# Patient Record
Sex: Male | Born: 1980 | Race: White | Hispanic: Yes | Marital: Single | Smoking: Never smoker
Health system: Southern US, Community
[De-identification: ages and names within clinical notes are randomized; demographics above are authoritative.]

## PROBLEM LIST (undated history)

## (undated) DIAGNOSIS — K5792 Diverticulitis of intestine, part unspecified, without perforation or abscess without bleeding: Secondary | ICD-10-CM

## (undated) DIAGNOSIS — I10 Essential (primary) hypertension: Secondary | ICD-10-CM

---

## 2015-02-01 ENCOUNTER — Encounter: Payer: Self-pay | Admitting: *Deleted

## 2015-02-01 ENCOUNTER — Ambulatory Visit
Admission: EM | Admit: 2015-02-01 | Discharge: 2015-02-01 | Disposition: A | Discharge status: Home / Self Care | Payer: Managed Care, Other (non HMO) | Attending: Family Medicine | Admitting: Family Medicine

## 2015-02-01 DIAGNOSIS — Z024 Encounter for examination for driving license: Secondary | ICD-10-CM

## 2015-02-01 DIAGNOSIS — Z029 Encounter for administrative examinations, unspecified: Secondary | ICD-10-CM | POA: Diagnosis not present

## 2015-02-01 LAB — URINALYSIS COMPLETE WITH MICROSCOPIC (ARMC ONLY)
BILIRUBIN URINE: NEGATIVE
Bacteria, UA: NONE SEEN
GLUCOSE, UA: NEGATIVE mg/dL
Hgb urine dipstick: NEGATIVE
Ketones, ur: NEGATIVE mg/dL
Leukocytes, UA: NEGATIVE
Nitrite: NEGATIVE
Protein, ur: NEGATIVE mg/dL
SQUAMOUS EPITHELIAL / LPF: NONE SEEN
Specific Gravity, Urine: >1.03 — ABNORMAL HIGH (ref 1.005–1.030)
pH: 6 (ref 5.0–8.0)

## 2015-02-01 NOTE — ED Notes (Signed)
CDL physical

## 2015-02-01 NOTE — ED Provider Notes (Signed)
CSN: [id number]     Arrival date & time 02/01/15  0908 History   First MD Initiated Contact with Patient 02/01/15 1047     Chief Complaint  Patient presents with  . Commercial Driver's License Exam   (Consider location/radiation/quality/duration/timing/severity/associated sxs/prior Treatment) HPI   This a 35 year old male presents for a DOT physical. He has no previous history of any medical or surgical conditions.  History reviewed. No pertinent past medical history. History reviewed. No pertinent past surgical history. History reviewed. No pertinent family history. Social History  Substance Use Topics  . Smoking status: Never Smoker   . Smokeless tobacco: Never Used  . Alcohol Use: Yes    Review of Systems  All other systems reviewed and are negative.   Allergies  Shellfish allergy  Home Medications   Prior to Admission medications   Not on File   Meds Ordered and Administered this Visit  Medications - No data to display  BP 141/90 mmHg  Temp(Src) 97.4 F (36.3 C) (Oral)  Resp 20  Ht 5\' 10"  (1.778 m)  Wt 285 lb (129.275 kg)  BMI 40.89 kg/m2  SpO2 100% No data found.   Physical Exam  Constitutional:  Refer to DOT sheet  Nursing note and vitals reviewed.   ED Course  Procedures (including critical care time)  Labs Review Labs Reviewed  URINALYSIS COMPLETEWITH MICROSCOPIC ([facility] ONLY) - Abnormal; Notable for the following:    Specific Gravity, Urine >1.030 (*)    All other components within normal limits    Imaging Review No results found.   Visual Acuity Review  Right Eye Distance:   Left Eye Distance:   Bilateral Distance:    Right Eye Near:   Left Eye Near:    Bilateral Near:         MDM   1. Driver's permit physical examination    I will qualify him for 2 year certificate. His initial blood pressure was elevated but after a repeat later into the exam it was more normal. He states that his blood pressures are usually in the 120  systolic range. He may have an element of whitecoat hypertension accounting for his elevated blood pressures. I've asked him to watch these but they seem to remain elevated he should see his primary care physician.    Lutricia FeilWilliam P Nazia Rhines, PA-C 02/01/15 1157  Lutricia FeilWilliam P Muntaha Vermette, PA-C 02/01/15 1159

## 2015-06-25 ENCOUNTER — Emergency Department
Admission: EM | Admit: 2015-06-25 | Discharge: 2015-06-25 | Disposition: A | Discharge status: Home / Self Care | Payer: Managed Care, Other (non HMO) | Attending: Emergency Medicine | Admitting: Emergency Medicine

## 2015-06-25 ENCOUNTER — Encounter: Payer: Self-pay | Admitting: Emergency Medicine

## 2015-06-25 ENCOUNTER — Emergency Department: Payer: Managed Care, Other (non HMO)

## 2015-06-25 DIAGNOSIS — R109 Unspecified abdominal pain: Secondary | ICD-10-CM

## 2015-06-25 DIAGNOSIS — K5732 Diverticulitis of large intestine without perforation or abscess without bleeding: Secondary | ICD-10-CM | POA: Insufficient documentation

## 2015-06-25 LAB — COMPREHENSIVE METABOLIC PANEL
ALBUMIN: 4.2 g/dL (ref 3.5–5.0)
ALK PHOS: 53 U/L (ref 38–126)
ALT: 63 U/L (ref 17–63)
AST: 29 U/L (ref 15–41)
Anion gap: 9 (ref 5–15)
BILIRUBIN TOTAL: 1.3 mg/dL — AB (ref 0.3–1.2)
BUN: 11 mg/dL (ref 6–20)
CALCIUM: 9 mg/dL (ref 8.9–10.3)
CO2: 23 mmol/L (ref 22–32)
CREATININE: 0.66 mg/dL (ref 0.61–1.24)
Chloride: 104 mmol/L (ref 101–111)
GFR calc non Af Amer: >60 mL/min (ref >60)
GLUCOSE: 99 mg/dL (ref 65–99)
Potassium: 3.6 mmol/L (ref 3.5–5.1)
SODIUM: 136 mmol/L (ref 135–145)
TOTAL PROTEIN: 8.1 g/dL (ref 6.5–8.1)

## 2015-06-25 LAB — URINALYSIS COMPLETE WITH MICROSCOPIC (ARMC ONLY)
BILIRUBIN URINE: NEGATIVE
Bacteria, UA: NONE SEEN
GLUCOSE, UA: NEGATIVE mg/dL
Hgb urine dipstick: NEGATIVE
KETONES UR: NEGATIVE mg/dL
Leukocytes, UA: NEGATIVE
Nitrite: NEGATIVE
Protein, ur: NEGATIVE mg/dL
Specific Gravity, Urine: 1.023 (ref 1.005–1.030)
pH: 6 (ref 5.0–8.0)

## 2015-06-25 LAB — CBC
HCT: 42.8 % (ref 40.0–52.0)
Hemoglobin: 14.8 g/dL (ref 13.0–18.0)
MCH: 30.7 pg (ref 26.0–34.0)
MCHC: 34.7 g/dL (ref 32.0–36.0)
MCV: 88.5 fL (ref 80.0–100.0)
PLATELETS: 291 10*3/uL (ref 150–440)
RBC: 4.83 MIL/uL (ref 4.40–5.90)
RDW: 13.2 % (ref 11.5–14.5)
WBC: 12.7 10*3/uL — ABNORMAL HIGH (ref 3.8–10.6)

## 2015-06-25 LAB — LIPASE, BLOOD: Lipase: 16 U/L (ref 11–51)

## 2015-06-25 MED ORDER — ONDANSETRON HCL 4 MG/2ML IJ SOLN
4.0000 mg | Freq: Once | INTRAMUSCULAR | Status: AC
  Administered 2015-06-25: 4 mg via INTRAVENOUS
  Filled 2015-06-25: qty 2

## 2015-06-25 MED ORDER — METRONIDAZOLE 500 MG PO TABS: 500.0000 mg | ORAL_TABLET | Freq: Two times a day (BID) | ORAL | Status: DC

## 2015-06-25 MED ORDER — DOCUSATE SODIUM 100 MG PO CAPS
100.0000 mg | ORAL_CAPSULE | Freq: Two times a day (BID) | ORAL | Status: AC
Start: 2015-06-25 — End: 2016-06-24

## 2015-06-25 MED ORDER — CIPROFLOXACIN HCL 500 MG PO TABS: 500.0000 mg | ORAL_TABLET | Freq: Two times a day (BID) | ORAL | Status: DC

## 2015-06-25 MED ORDER — OXYCODONE-ACETAMINOPHEN 5-325 MG PO TABS: 1.0000 | ORAL_TABLET | Freq: Four times a day (QID) | ORAL | Status: AC | PRN

## 2015-06-25 MED ORDER — KETOROLAC TROMETHAMINE 30 MG/ML IJ SOLN
30.0000 mg | Freq: Once | INTRAMUSCULAR | Status: AC
  Administered 2015-06-25: 30 mg via INTRAVENOUS
  Filled 2015-06-25: qty 1

## 2015-06-25 MED ORDER — SODIUM CHLORIDE 0.9 % IV SOLN
1000.0000 mL | Freq: Once | INTRAVENOUS | Status: AC
  Administered 2015-06-25: 1000 mL via INTRAVENOUS

## 2015-06-25 MED ORDER — MORPHINE SULFATE (PF) 4 MG/ML IV SOLN
4.0000 mg | Freq: Once | INTRAVENOUS | Status: AC
  Administered 2015-06-25: 4 mg via INTRAVENOUS
  Filled 2015-06-25: qty 1

## 2015-06-25 NOTE — Discharge Instructions (Signed)
Diverticulitis °Diverticulitis is inflammation or infection of small pouches in your colon that form when you have a condition called diverticulosis. The pouches in your colon are called diverticula. Your colon, or large intestine, is where water is absorbed and stool is formed. °Complications of diverticulitis can include: °· Bleeding. °· Severe infection. °· Severe pain. °· Perforation of your colon. °· Obstruction of your colon. °CAUSES  °Diverticulitis is caused by bacteria. °Diverticulitis happens when stool becomes trapped in diverticula. This allows bacteria to grow in the diverticula, which can lead to inflammation and infection. °RISK FACTORS °People with diverticulosis are at risk for diverticulitis. Eating a diet that does not include enough fiber from fruits and vegetables may make diverticulitis more likely to develop. °SYMPTOMS  °Symptoms of diverticulitis may include: °· Abdominal pain and tenderness. The pain is normally located on the left side of the abdomen, but may occur in other areas. °· Fever and chills. °· Bloating. °· Cramping. °· Nausea. °· Vomiting. °· Constipation. °· Diarrhea. °· Blood in your stool. °DIAGNOSIS  °Your health care provider will ask you about your medical history and do a physical exam. You may need to have tests done because many medical conditions can cause the same symptoms as diverticulitis. Tests may include: °· Blood tests. °· Urine tests. °· Imaging tests of the abdomen, including X-rays and CT scans. °When your condition is under control, your health care provider may recommend that you have a colonoscopy. A colonoscopy can show how severe your diverticula are and whether something else is causing your symptoms. °TREATMENT  °Most cases of diverticulitis are mild and can be treated at home. Treatment may include: °· Taking over-the-counter pain medicines. °· Following a clear liquid diet. °· Taking antibiotic medicines by mouth for 7-10 days. °More severe cases may  be treated at a hospital. Treatment may include: °· Not eating or drinking. °· Taking prescription pain medicine. °· Receiving antibiotic medicines through an IV tube. °· Receiving fluids and nutrition through an IV tube. °· Surgery. °HOME CARE INSTRUCTIONS  °· Follow your health care provider's instructions carefully. °· Follow a full liquid diet or other diet as directed by your health care provider. After your symptoms improve, your health care provider may tell you to change your diet. He or she may recommend you eat a high-fiber diet. Fruits and vegetables are good sources of fiber. Fiber makes it easier to pass stool. °· Take fiber supplements or probiotics as directed by your health care provider. °· Only take medicines as directed by your health care provider. °· Keep all your follow-up appointments. °SEEK MEDICAL CARE IF:  °· Your pain does not improve. °· You have a hard time eating food. °· Your bowel movements do not return to normal. °SEEK IMMEDIATE MEDICAL CARE IF:  °· Your pain becomes worse. °· Your symptoms do not get better. °· Your symptoms suddenly get worse. °· You have a fever. °· You have repeated vomiting. °· You have bloody or black, tarry stools. °MAKE SURE YOU:  °· Understand these instructions. °· Will watch your condition. °· Will get help right away if you are not doing well or get worse. °  °This information is not intended to replace advice given to you by your health care provider. Make sure you discuss any questions you have with your health care provider. °  °Document Released: 10/16/2004 Document Revised: 01/11/2013 Document Reviewed: 12/01/2012 °Elsevier Interactive Patient Education ©2016 [employer] ° °

## 2015-06-25 NOTE — ED Notes (Signed)
L sided abdominal began yesterday.

## 2015-06-25 NOTE — ED Provider Notes (Signed)
[facility] Emergency Department Provider Note  ____________________________________________    I have reviewed the triage vital signs and the nursing notes.   HISTORY  Chief Complaint Abdominal Pain    HPI Thomas Orozco is a 35 y.o. male who presents with cramping left abdominal pain. Patient complains of left-sided abdominal pain that began yesterday and has been constant. He has never had this before. He denies a history of kidney stones. No blood in his urine. No fevers or chills. No recent travel. No diarrhea. Mild nausea but no vomiting.     History reviewed. No pertinent past medical history.  There are no active problems to display for this patient.   History reviewed. No pertinent past surgical history.  No current outpatient prescriptions on file.  Allergies Shellfish allergy  No family history on file.  Social History Social History  Substance Use Topics  . Smoking status: Never Smoker   . Smokeless tobacco: Never Used  . Alcohol Use: Yes    Review of Systems  Constitutional: Negative for fever. Eyes: Negative for redness ENT: Negative for sore throat Cardiovascular: Negative for chest pain Respiratory: Negative for shortness of breath. Gastrointestinal: As above Genitourinary: Negative for dysuria. Musculoskeletal: Negative for back pain. Skin: Negative for rash. Neurological: Negative for focal weakness Psychiatric: no anxiety    ____________________________________________   PHYSICAL EXAM:  VITAL SIGNS: ED Triage Vitals  Enc Vitals Group     BP 06/25/15 1140 134/95 mmHg     Pulse Rate 06/25/15 1140 86     Resp 06/25/15 1140 18     Temp 06/25/15 1140 98.1 F (36.7 C)     Temp Source 06/25/15 1140 Oral     SpO2 06/25/15 1140 98 %     Weight 06/25/15 1140 280 lb (127.007 kg)     Height 06/25/15 1140 5\' 11"  (1.803 m)     Head Cir --      Peak Flow --      Pain Score 06/25/15 1142 10     Pain Loc --      Pain Edu? --      Excl. in GC? --      Constitutional: Alert and oriented. Uncomfortable but no acute distress Eyes: Conjunctivae are normal. No erythema or injection ENT   Head: Normocephalic and atraumatic.   Mouth/Throat: Mucous membranes are moist. Cardiovascular: Normal rate, regular rhythm. Normal and symmetric distal pulses are present in the upper extremities. No murmurs or rubs  Respiratory: Normal respiratory effort without tachypnea nor retractions. Breath sounds are clear and equal bilaterally.  Gastrointestinal: Tenderness to palpation left upper quadrant left lower quadrant. No distention. There is no CVA tenderness. Genitourinary: deferred Musculoskeletal: Nontender with normal range of motion in all extremities. No lower extremity tenderness nor edema. Neurologic:  Normal speech and language. No gross focal neurologic deficits are appreciated. Skin:  Skin is warm, dry and intact. No rash noted. Psychiatric: Mood and affect are normal. Patient exhibits appropriate insight and judgment.  ____________________________________________    LABS (pertinent positives/negatives)  Labs Reviewed  COMPREHENSIVE METABOLIC PANEL - Abnormal; Notable for the following:    Total Bilirubin 1.3 (*)    All other components within normal limits  CBC - Abnormal; Notable for the following:    WBC 12.7 (*)    All other components within normal limits  URINALYSIS COMPLETEWITH MICROSCOPIC ([facility] ONLY) - Abnormal; Notable for the following:    Color, Urine YELLOW (*)    APPearance CLEAR (*)  Squamous Epithelial / LPF 0-5 (*)    All other components within normal limits  LIPASE, BLOOD    ____________________________________________   EKG  None  ____________________________________________    RADIOLOGY  CT renal stone study shows diverticulitis  ____________________________________________   PROCEDURES  Procedure(s) performed: none  Critical Care performed:  none  ____________________________________________   INITIAL IMPRESSION / ASSESSMENT AND PLAN / ED COURSE  Pertinent labs & imaging results that were available during my care of the patient were reviewed by me and considered in my medical decision making (see chart for details).  Given patient's relatively abrupt development of pain I'm suspicious of a kidney stone. However urinalysis is unremarkable. Diverticulitis less likely given his age. We will treat with Toradol IV  ----------------------------------------- 2:17 PM on 06/25/2015 -----------------------------------------  CT scan consistent with diverticulitis. Patient continues to be in pain we will give morphine and Zofran IV.   ----------------------------------------- 3:06 PM on 06/25/2015 -----------------------------------------   I offered admission to the patient that he would prefer to go home. We will discharge him with Percocet, Flagyl, Cipro, Colace. We discussed return precautions at length.  ____________________________________________   FINAL CLINICAL IMPRESSION(S) / ED DIAGNOSES  Final diagnoses:  Flank pain  Diverticulitis of large intestine without perforation or abscess without bleeding          Jene Every, MD 06/25/15 1506

## 2015-06-25 NOTE — ED Notes (Signed)
Patient transported to CT at this time. 

## 2016-06-10 ENCOUNTER — Encounter: Payer: Self-pay | Admitting: Emergency Medicine

## 2016-06-10 ENCOUNTER — Emergency Department
Admission: EM | Admit: 2016-06-10 | Discharge: 2016-06-10 | Disposition: A | Discharge status: Home / Self Care | Payer: Managed Care, Other (non HMO) | Attending: Emergency Medicine | Admitting: Emergency Medicine

## 2016-06-10 DIAGNOSIS — H9201 Otalgia, right ear: Secondary | ICD-10-CM | POA: Diagnosis present

## 2016-06-10 DIAGNOSIS — Z79899 Other long term (current) drug therapy: Secondary | ICD-10-CM | POA: Insufficient documentation

## 2016-06-10 DIAGNOSIS — H60501 Unspecified acute noninfective otitis externa, right ear: Secondary | ICD-10-CM | POA: Diagnosis not present

## 2016-06-10 MED ORDER — IBUPROFEN 400 MG PO TABS
400.0000 mg | ORAL_TABLET | Freq: Once | ORAL | Status: AC | PRN
  Administered 2016-06-10: 400 mg via ORAL

## 2016-06-10 MED ORDER — TRAMADOL HCL 50 MG PO TABS
50.0000 mg | ORAL_TABLET | Freq: Once | ORAL | Status: AC
  Administered 2016-06-10: 50 mg via ORAL
  Filled 2016-06-10: qty 1

## 2016-06-10 MED ORDER — IBUPROFEN 400 MG PO TABS
ORAL_TABLET | ORAL | Status: AC
  Filled 2016-06-10: qty 1

## 2016-06-10 MED ORDER — TRAMADOL HCL 50 MG PO TABS
50.0000 mg | ORAL_TABLET | Freq: Four times a day (QID) | ORAL | 0 refills | Status: AC | PRN
Start: 2016-06-10 — End: ?

## 2016-06-10 MED ORDER — KETOROLAC TROMETHAMINE 60 MG/2ML IM SOLN
60.0000 mg | Freq: Once | INTRAMUSCULAR | Status: AC
  Administered 2016-06-10: 60 mg via INTRAMUSCULAR
  Filled 2016-06-10: qty 2

## 2016-06-10 MED ORDER — OFLOXACIN 0.3 % OT SOLN: 5.0000 [drp] | Freq: Two times a day (BID) | OTIC | 0 refills | Status: AC

## 2016-06-10 NOTE — Discharge Instructions (Signed)
Please follow up and have your ear reevaluated.

## 2016-06-10 NOTE — ED Notes (Signed)

## 2016-06-10 NOTE — ED Provider Notes (Signed)
[facility] Emergency Department Provider Note   ____________________________________________   First MD Initiated Contact with Patient 06/10/16 0631     (approximate)  I have reviewed the triage vital signs and the nursing notes.   HISTORY  Chief Complaint Otalgia    HPI Thomas Orozco is a 36 y.o. male who comes into the hospital today with ear pain. The patient reports that he had been having sharp pain in his right ear since about a week ago. He went to urgent care yesterday morning and they prescribed him amoxicillin and told him to take ibuprofen. He reports that he's been taking the medication but he cannot take the pain anymore. The patient reports that he took some medications last night but his pain is still a 10 out of 10 in intensity. The patient denies any fevers chest pain. He feels as though something is in his ear. The patient decided to come into the hospital today for further evaluation of his ear pain.   History reviewed. No pertinent past medical history.  There are no active problems to display for this patient.   History reviewed. No pertinent surgical history.  Prior to Admission medications   Medication Sig Start Date End Date Taking? Authorizing Provider  ciprofloxacin (CIPRO) 500 MG tablet Take 1 tablet (500 mg total) by mouth 2 (two) times daily. 06/25/15   Jene EveryKinner, Robert, MD  docusate sodium (COLACE) 100 MG capsule Take 1 capsule (100 mg total) by mouth 2 (two) times daily. 06/25/15 06/24/16  Jene EveryKinner, Robert, MD  metroNIDAZOLE (FLAGYL) 500 MG tablet Take 1 tablet (500 mg total) by mouth 2 (two) times daily after a meal. 06/25/15   Jene EveryKinner, Robert, MD  ofloxacin (FLOXIN) 0.3 % otic solution Place 5 drops into the right ear 2 (two) times daily. 06/10/16 06/20/16  Rebecka ApleyWebster, Allison P, MD  oxyCODONE-acetaminophen (ROXICET) 5-325 MG tablet Take 1 tablet by mouth every 6 (six) hours as needed. 06/25/15 06/24/16  Jene EveryKinner, Robert, MD  traMADol  (ULTRAM) 50 MG tablet Take 1 tablet (50 mg total) by mouth every 6 (six) hours as needed. 06/10/16   Rebecka ApleyWebster, Allison P, MD    Allergies Shellfish allergy  No family history on file.  Social History Social History  Substance Use Topics  . Smoking status: Never Smoker  . Smokeless tobacco: Never Used  . Alcohol use Yes    Review of Systems  Constitutional: No fever/chills Eyes: No visual changes. ENT: Right ear pain Cardiovascular: Denies chest pain. Respiratory: Denies shortness of breath. Gastrointestinal: No abdominal pain.  No nausea, no vomiting.  No diarrhea.  No constipation. Genitourinary: Negative for dysuria. Musculoskeletal: Negative for back pain. Skin: Negative for rash. Neurological: Negative for headaches, focal weakness or numbness.   ____________________________________________   PHYSICAL EXAM:  VITAL SIGNS: ED Triage Vitals  Enc Vitals Group     BP 06/10/16 0302 (!) 143/89     Pulse Rate 06/10/16 0302 93     Resp 06/10/16 0302 20     Temp 06/10/16 0302 98.4 F (36.9 C)     Temp Source 06/10/16 0302 Oral     SpO2 06/10/16 0302 99 %     Weight 06/10/16 0302 (!) 315 lb (142.9 kg)     Height 06/10/16 0302 5\' 11"  (1.803 m)     Head Circumference --      Peak Flow --      Pain Score 06/10/16 0301 10     Pain Loc --  Pain Edu? --      Excl. in GC? --     Constitutional: Alert and oriented. Well appearing and in Moderate distress. Ear: Tenderness to palpation of the right tragus with some material in the patient's ear canal and some erythema in the ear canal. Patient has significant tenderness to palpation when evaluating his ear. I am unable to visualize the tympanic membrane due to the material in his ear. Eyes: Conjunctivae are normal. PERRL. EOMI. Head: Atraumatic. Nose: No congestion/rhinnorhea. Mouth/Throat: Mucous membranes are moist.  Oropharynx non-erythematous. Cardiovascular: Normal rate, regular rhythm. Grossly normal heart sounds.   Good peripheral circulation. Respiratory: Normal respiratory effort.  No retractions. Lungs CTAB. Gastrointestinal: Soft and nontender. No distention. Positive bowel sounds Musculoskeletal: No lower extremity tenderness nor edema.   Neurologic:  Normal speech and language.  Skin:  Skin is warm, dry and intact. Marland Kitchen Psychiatric: Mood and affect are normal.   ____________________________________________   LABS (all labs ordered are listed, but only abnormal results are displayed)  Labs Reviewed - No data to display ____________________________________________  EKG  none ____________________________________________  RADIOLOGY  none ____________________________________________   PROCEDURES  Procedure(s) performed: None  Procedures  Critical Care performed: No  ____________________________________________   INITIAL IMPRESSION / ASSESSMENT AND PLAN / ED COURSE  Pertinent labs & imaging results that were available during my care of the patient were reviewed by me and considered in my medical decision making (see chart for details).  This is a 36 year old male who comes into the hospital today with some right ear pain. The patient was diagnosed with otitis media and given amoxicillin. On examination it appears that the patient has otitis externa. I give the patient a shot of Toradol as well as dose of tramadol. I informed the patient that he needs to be on eardrops which will help his pain and his symptoms much faster than oral medications. The patient will be discharged to home with a prescription for ofloxacin otic drops.      ____________________________________________   FINAL CLINICAL IMPRESSION(S) / ED DIAGNOSES  Final diagnoses:  Acute otitis externa of right ear, unspecified type      NEW MEDICATIONS STARTED DURING THIS VISIT:  New Prescriptions   OFLOXACIN (FLOXIN) 0.3 % OTIC SOLUTION    Place 5 drops into the right ear 2 (two) times daily.   TRAMADOL  (ULTRAM) 50 MG TABLET    Take 1 tablet (50 mg total) by mouth every 6 (six) hours as needed.     Note:  This document was prepared using Dragon voice recognition software and may include unintentional dictation errors.    Rebecka Apley, MD 06/10/16 [phone]

## 2016-06-10 NOTE — ED Triage Notes (Signed)
Pt presents to ED with right ear pain since Saturday. Pt was seen by his pcp this morning and dx with ear infection and prescribed amoxicillin. Pt states he took his antibiotic tonight along with tylenol with no relief. Pt states he is wanting something to help with his pain.

## 2016-10-12 ENCOUNTER — Emergency Department
Admission: EM | Admit: 2016-10-12 | Discharge: 2016-10-13 | Disposition: A | Discharge status: Home / Self Care | Payer: Managed Care, Other (non HMO) | Attending: Emergency Medicine | Admitting: Emergency Medicine

## 2016-10-12 ENCOUNTER — Encounter: Payer: Self-pay | Admitting: Emergency Medicine

## 2016-10-12 ENCOUNTER — Emergency Department: Payer: Managed Care, Other (non HMO)

## 2016-10-12 DIAGNOSIS — R1032 Left lower quadrant pain: Secondary | ICD-10-CM | POA: Diagnosis present

## 2016-10-12 DIAGNOSIS — Z79899 Other long term (current) drug therapy: Secondary | ICD-10-CM | POA: Insufficient documentation

## 2016-10-12 DIAGNOSIS — K5792 Diverticulitis of intestine, part unspecified, without perforation or abscess without bleeding: Secondary | ICD-10-CM

## 2016-10-12 HISTORY — DX: Diverticulitis of intestine, part unspecified, without perforation or abscess without bleeding: K57.92

## 2016-10-12 LAB — COMPREHENSIVE METABOLIC PANEL
ALBUMIN: 4.1 g/dL (ref 3.5–5.0)
ALT: 94 U/L — ABNORMAL HIGH (ref 17–63)
AST: 54 U/L — AB (ref 15–41)
Alkaline Phosphatase: 61 U/L (ref 38–126)
Anion gap: 10 (ref 5–15)
BUN: 12 mg/dL (ref 6–20)
CHLORIDE: 107 mmol/L (ref 101–111)
CO2: 24 mmol/L (ref 22–32)
CREATININE: 0.78 mg/dL (ref 0.61–1.24)
Calcium: 9.2 mg/dL (ref 8.9–10.3)
GFR calc Af Amer: >60 mL/min (ref >60)
GFR calc non Af Amer: >60 mL/min (ref >60)
GLUCOSE: 125 mg/dL — AB (ref 65–99)
POTASSIUM: 3.2 mmol/L — AB (ref 3.5–5.1)
SODIUM: 141 mmol/L (ref 135–145)
Total Bilirubin: 0.7 mg/dL (ref 0.3–1.2)
Total Protein: 7.5 g/dL (ref 6.5–8.1)

## 2016-10-12 LAB — CBC
HEMATOCRIT: 40.9 % (ref 40.0–52.0)
Hemoglobin: 14.5 g/dL (ref 13.0–18.0)
MCH: 30.9 pg (ref 26.0–34.0)
MCHC: 35.5 g/dL (ref 32.0–36.0)
MCV: 87 fL (ref 80.0–100.0)
PLATELETS: 323 10*3/uL (ref 150–440)
RBC: 4.7 MIL/uL (ref 4.40–5.90)
RDW: 13.2 % (ref 11.5–14.5)
WBC: 9.1 10*3/uL (ref 3.8–10.6)

## 2016-10-12 LAB — LIPASE, BLOOD: LIPASE: 26 U/L (ref 11–51)

## 2016-10-12 MED ORDER — HYDROMORPHONE HCL 1 MG/ML IJ SOLN
1.0000 mg | Freq: Once | INTRAMUSCULAR | Status: AC
  Administered 2016-10-12: 1 mg via INTRAVENOUS

## 2016-10-12 MED ORDER — HYDROMORPHONE HCL 1 MG/ML IJ SOLN
INTRAMUSCULAR | Status: AC
  Administered 2016-10-12: 1 mg via INTRAVENOUS
  Filled 2016-10-12: qty 1

## 2016-10-12 MED ORDER — ONDANSETRON HCL 4 MG/2ML IJ SOLN
4.0000 mg | Freq: Once | INTRAMUSCULAR | Status: AC
  Administered 2016-10-12: 4 mg via INTRAVENOUS

## 2016-10-12 MED ORDER — IOPAMIDOL (ISOVUE-300) INJECTION 61%
15.0000 mL | INTRAVENOUS | Status: AC
  Administered 2016-10-12 (×2): 15 mL via ORAL

## 2016-10-12 MED ORDER — ONDANSETRON HCL 4 MG/2ML IJ SOLN
INTRAMUSCULAR | Status: AC
  Administered 2016-10-12: 4 mg via INTRAVENOUS
  Filled 2016-10-12: qty 2

## 2016-10-12 MED ORDER — IOPAMIDOL (ISOVUE-300) INJECTION 61%: 30.0000 mL | Freq: Once | INTRAVENOUS | Status: DC | PRN

## 2016-10-12 MED ORDER — IOPAMIDOL (ISOVUE-300) INJECTION 61%
125.0000 mL | Freq: Once | INTRAVENOUS | Status: AC | PRN
  Administered 2016-10-12: 125 mL via INTRAVENOUS

## 2016-10-12 NOTE — ED Triage Notes (Signed)
Patient with complaint of sudden onset left side abdominal pain that started about an hour ago. Patient states that he has a history of diverticulitis and the pain feels similar.

## 2016-10-12 NOTE — ED Provider Notes (Signed)
[facility] Emergency Department Provider Note  ____________________________________________  Time seen: Approximately 11:05 PM  I have reviewed the triage vital signs and the nursing notes.   HISTORY  Chief Complaint Abdominal Pain    HPI Thomas Orozco is a 36 y.o. male who complains of sudden onset left sided abdominal pain about an hour ago, mostly in the left lower quadrant but also in the left mid abdomen. Nonradiating. No aggravating or alleviating factors. Feels like previous episode of diverticulitis. No fevers chills sweats vomiting diarrhea or constipation. He rates it as severe and sharp.     Past Medical History:  Diagnosis Date  . Diverticulitis      There are no active problems to display for this patient.    History reviewed. No pertinent surgical history.   Prior to Admission medications   Medication Sig Start Date End Date Taking? Authorizing Provider  ciprofloxacin (CIPRO) 500 MG tablet Take 1 tablet (500 mg total) by mouth 2 (two) times daily. 06/25/15   Jene Every, MD  metroNIDAZOLE (FLAGYL) 500 MG tablet Take 1 tablet (500 mg total) by mouth 2 (two) times daily after a meal. 06/25/15   Jene Every, MD  traMADol (ULTRAM) 50 MG tablet Take 1 tablet (50 mg total) by mouth every 6 (six) hours as needed. 06/10/16   Rebecka Apley, MD     Allergies Shellfish allergy   No family history on file.  Social History Social History  Substance Use Topics  . Smoking status: Never Smoker  . Smokeless tobacco: Never Used  . Alcohol use Yes     Comment: occ    Review of Systems  Constitutional:   No fever or chills.  ENT:   No sore throat. No rhinorrhea. Cardiovascular:   No chest pain or syncope. Respiratory:   No dyspnea or cough. Gastrointestinal:   Positive as above for abdominal pain without vomiting and diarrhea.  Musculoskeletal:   Negative for focal pain or swelling All other systems reviewed and are  negative except as documented above in ROS and HPI.  ____________________________________________   PHYSICAL EXAM:  VITAL SIGNS: ED Triage Vitals  Enc Vitals Group     BP 10/12/16 2141 (!) 139/97     Pulse Rate 10/12/16 2141 89     Resp 10/12/16 2141 20     Temp 10/12/16 2141 98 F (36.7 C)     Temp Source 10/12/16 2141 Oral     SpO2 10/12/16 2141 96 %     Weight 10/12/16 2138 300 lb (136.1 kg)     Height 10/12/16 2138  (1.803 m)     Head Circumference --      Peak Flow --      Pain Score 10/12/16 2138 10     Pain Loc --      Pain Edu? --      Excl. in GC? --     Vital signs reviewed, nursing assessments reviewed.   Constitutional:   Alert and oriented. Well appearing and in no distress. Eyes:   No scleral icterus.  EOMI. No nystagmus. No conjunctival pallor. PERRL. ENT   Head:   Normocephalic and atraumatic.   Nose:   No congestion/rhinnorhea.    Mouth/Throat:   MMM, no pharyngeal erythema. No peritonsillar mass.    Neck:   No meningismus. Full ROM Hematological/Lymphatic/Immunilogical:   No cervical lymphadenopathy. Cardiovascular:   RRR. Symmetric bilateral radial and DP pulses.  No murmurs.  Respiratory:   Normal respiratory effort  without tachypnea/retractions. Breath sounds are clear and equal bilaterally. No wheezes/rales/rhonchi. Gastrointestinal:   Soft With left lower quadrant tenderness. Non distended. There is no CVA tenderness.  No rebound, rigidity, or guarding. Genitourinary:   deferred Musculoskeletal:   Normal range of motion in all extremities. No joint effusions.  No lower extremity tenderness.  No edema. Neurologic:   Normal speech and language.  Motor grossly intact. No gross focal neurologic deficits are appreciated.  Skin:    Skin is warm, dry and intact. No rash noted.  No petechiae, purpura, or bullae.  ____________________________________________    LABS (pertinent positives/negatives) (all labs ordered are listed, but  only abnormal results are displayed) Labs Reviewed  COMPREHENSIVE METABOLIC PANEL - Abnormal; Notable for the following:       Result Value   Potassium 3.2 (*)    Glucose, Bld 125 (*)    AST 54 (*)    ALT 94 (*)    All other components within normal limits  LIPASE, BLOOD  CBC  URINALYSIS, COMPLETE (UACMP) WITH MICROSCOPIC   ____________________________________________   EKG    ____________________________________________    RADIOLOGY  No results found.  ____________________________________________   PROCEDURES Procedures  ____________________________________________   INITIAL IMPRESSION / ASSESSMENT AND PLAN / ED COURSE  Pertinent labs & imaging results that were available during my care of the patient were reviewed by me and considered in my medical decision making (see chart for details).  Presents with abdominal pain concerning for diverticulitis versus kidney stone. Other possibilities include UTI perforation obstruction, although lower likelihood. We'll get a CT scan, check labs. IV fluids, Dilaudid, Zofran as needed.  Clinical Course as of Oct 12 2305  Wynelle Link Oct 12, 2016  2238 Labs, vitals unremarkable.  [PS]    Clinical Course User Index [PS] Sharman Cheek, MD     ____________________________________________   FINAL CLINICAL IMPRESSION(S) / ED DIAGNOSES  Final diagnoses:  Left lower quadrant pain      New Prescriptions   No medications on file     Portions of this note were generated with dragon dictation software. Dictation errors may occur despite best attempts at proofreading.    Sharman Cheek, MD 10/12/16 [phone]

## 2016-10-13 LAB — URINALYSIS, COMPLETE (UACMP) WITH MICROSCOPIC
Bilirubin Urine: NEGATIVE
GLUCOSE, UA: NEGATIVE mg/dL
Ketones, ur: NEGATIVE mg/dL
Leukocytes, UA: NEGATIVE
Nitrite: NEGATIVE
PH: 7 (ref 5.0–8.0)
Protein, ur: NEGATIVE mg/dL
SPECIFIC GRAVITY, URINE: 1.031 — AB (ref 1.005–1.030)
SQUAMOUS EPITHELIAL / LPF: NONE SEEN

## 2016-10-13 MED ORDER — METRONIDAZOLE 500 MG PO TABS
500.0000 mg | ORAL_TABLET | Freq: Once | ORAL | Status: AC
  Administered 2016-10-13: 500 mg via ORAL
  Filled 2016-10-13: qty 1

## 2016-10-13 MED ORDER — OXYCODONE-ACETAMINOPHEN 5-325 MG PO TABS
ORAL_TABLET | ORAL | Status: AC
  Filled 2016-10-13: qty 1

## 2016-10-13 MED ORDER — CIPROFLOXACIN HCL 500 MG PO TABS
500.0000 mg | ORAL_TABLET | Freq: Once | ORAL | Status: AC
  Administered 2016-10-13: 500 mg via ORAL
  Filled 2016-10-13: qty 1

## 2016-10-13 MED ORDER — METRONIDAZOLE 500 MG PO TABS: 500.0000 mg | ORAL_TABLET | Freq: Two times a day (BID) | ORAL | 0 refills | Status: DC

## 2016-10-13 MED ORDER — IBUPROFEN 800 MG PO TABS: 800.0000 mg | ORAL_TABLET | Freq: Three times a day (TID) | ORAL | 0 refills | Status: AC | PRN

## 2016-10-13 MED ORDER — OXYCODONE-ACETAMINOPHEN 5-325 MG PO TABS
1.0000 | ORAL_TABLET | Freq: Once | ORAL | Status: AC
  Administered 2016-10-13: 1 via ORAL

## 2016-10-13 MED ORDER — CIPROFLOXACIN HCL 500 MG PO TABS: 500.0000 mg | ORAL_TABLET | Freq: Two times a day (BID) | ORAL | 0 refills | Status: DC

## 2016-10-13 MED ORDER — OXYCODONE-ACETAMINOPHEN 5-325 MG PO TABS: 1.0000 | ORAL_TABLET | ORAL | 0 refills | Status: AC | PRN

## 2016-10-13 MED ORDER — ONDANSETRON 4 MG PO TBDP
4.0000 mg | ORAL_TABLET | Freq: Three times a day (TID) | ORAL | 0 refills | Status: AC | PRN
Start: 2016-10-13 — End: ?

## 2016-10-13 NOTE — ED Provider Notes (Signed)
-----------------------------------------   1:15 AM on 10/13/2016 -----------------------------------------  CT abdomen and pelvis interpreted per Dr. Manus Gunning:  1. Mild left hydronephrosis in ureteral prominence, with  questionable ureteral enhancement in heterogeneous left renal  enhancement. Findings may reflect recently passed stone or urinary  tract infection. Recommend correlation with urinalysis.  2. Descending and sigmoid colonic diverticulosis, equivocal faint  pericolonic stranding in the mid sigmoid which may reflect early  diverticulitis.  3. Hepatomegaly, borderline splenomegaly.   Patient provided a urine specimen; sending for urinanalysis. Updated patient and spouse of CT imaging results. Antibiotics and analgesic ordered.  ----------------------------------------- 1:47 AM on 10/13/2016 -----------------------------------------  Updated patient and spouse of negative urinalysis. Will discharge home on Cipro, Flagyl, Motrin, Percocet and Zofran. 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 Strict return precautions given. Both verbalize understanding and agree with plan of care.   Irean Hong, MD 10/13/16 [phone]

## 2016-10-13 NOTE — Discharge Instructions (Signed)
1. Take antibiotics as prescribed: Cipro 500 mg twice daily 7 days Flagyl 500 mg twice daily 7 days 2. You may take pain and nausea medicines as needed (Motrin/Percocet/Zofran #15). 3. Return to the ER for worsening symptoms, persistent vomiting, fever, difficulty breathing or other concerns.

## 2016-10-14 ENCOUNTER — Encounter: Payer: Self-pay | Admitting: Emergency Medicine

## 2016-10-14 ENCOUNTER — Emergency Department
Admission: EM | Admit: 2016-10-14 | Discharge: 2016-10-14 | Disposition: A | Discharge status: Home / Self Care | Payer: Managed Care, Other (non HMO) | Attending: Emergency Medicine | Admitting: Emergency Medicine

## 2016-10-14 ENCOUNTER — Emergency Department: Payer: Managed Care, Other (non HMO)

## 2016-10-14 DIAGNOSIS — K5732 Diverticulitis of large intestine without perforation or abscess without bleeding: Secondary | ICD-10-CM | POA: Insufficient documentation

## 2016-10-14 DIAGNOSIS — K5792 Diverticulitis of intestine, part unspecified, without perforation or abscess without bleeding: Secondary | ICD-10-CM

## 2016-10-14 DIAGNOSIS — R1011 Right upper quadrant pain: Secondary | ICD-10-CM | POA: Insufficient documentation

## 2016-10-14 LAB — CBC
HEMATOCRIT: 45.5 % (ref 40.0–52.0)
Hemoglobin: 15.9 g/dL (ref 13.0–18.0)
MCH: 30.6 pg (ref 26.0–34.0)
MCHC: 35 g/dL (ref 32.0–36.0)
MCV: 87.4 fL (ref 80.0–100.0)
PLATELETS: 307 10*3/uL (ref 150–440)
RBC: 5.21 MIL/uL (ref 4.40–5.90)
RDW: 13.4 % (ref 11.5–14.5)
WBC: 11.7 10*3/uL — AB (ref 3.8–10.6)

## 2016-10-14 LAB — COMPREHENSIVE METABOLIC PANEL
ALT: 96 U/L — ABNORMAL HIGH (ref 17–63)
AST: 62 U/L — AB (ref 15–41)
Albumin: 4.4 g/dL (ref 3.5–5.0)
Alkaline Phosphatase: 49 U/L (ref 38–126)
Anion gap: 11 (ref 5–15)
BILIRUBIN TOTAL: 1.2 mg/dL (ref 0.3–1.2)
BUN: 16 mg/dL (ref 6–20)
CHLORIDE: 106 mmol/L (ref 101–111)
CO2: 21 mmol/L — ABNORMAL LOW (ref 22–32)
Calcium: 9.4 mg/dL (ref 8.9–10.3)
Creatinine, Ser: 0.66 mg/dL (ref 0.61–1.24)
Glucose, Bld: 117 mg/dL — ABNORMAL HIGH (ref 65–99)
POTASSIUM: 3.6 mmol/L (ref 3.5–5.1)
Sodium: 138 mmol/L (ref 135–145)
TOTAL PROTEIN: 8.3 g/dL — AB (ref 6.5–8.1)

## 2016-10-14 LAB — LIPASE, BLOOD: LIPASE: 20 U/L (ref 11–51)

## 2016-10-14 MED ORDER — IOPAMIDOL (ISOVUE-300) INJECTION 61%
125.0000 mL | Freq: Once | INTRAVENOUS | Status: AC | PRN
  Administered 2016-10-14: 125 mL via INTRAVENOUS
  Filled 2016-10-14: qty 150

## 2016-10-14 MED ORDER — METRONIDAZOLE 500 MG PO TABS
500.0000 mg | ORAL_TABLET | Freq: Once | ORAL | Status: AC
  Administered 2016-10-14: 500 mg via ORAL
  Filled 2016-10-14: qty 1

## 2016-10-14 MED ORDER — CIPROFLOXACIN HCL 500 MG PO TABS
500.0000 mg | ORAL_TABLET | Freq: Once | ORAL | Status: AC
  Administered 2016-10-14: 500 mg via ORAL
  Filled 2016-10-14: qty 1

## 2016-10-14 MED ORDER — IOPAMIDOL (ISOVUE-300) INJECTION 61%
30.0000 mL | Freq: Once | INTRAVENOUS | Status: AC | PRN
  Administered 2016-10-14: 30 mL via ORAL
  Filled 2016-10-14: qty 30

## 2016-10-14 MED ORDER — MORPHINE SULFATE (PF) 4 MG/ML IV SOLN
4.0000 mg | Freq: Once | INTRAVENOUS | Status: AC
  Administered 2016-10-14: 4 mg via INTRAVENOUS
  Filled 2016-10-14: qty 1

## 2016-10-14 MED ORDER — ONDANSETRON HCL 4 MG/2ML IJ SOLN
4.0000 mg | Freq: Once | INTRAMUSCULAR | Status: AC
  Administered 2016-10-14: 4 mg via INTRAVENOUS
  Filled 2016-10-14: qty 2

## 2016-10-14 MED ORDER — OXYCODONE-ACETAMINOPHEN 5-325 MG PO TABS
1.0000 | ORAL_TABLET | Freq: Once | ORAL | Status: AC
  Administered 2016-10-14: 1 via ORAL
  Filled 2016-10-14: qty 1

## 2016-10-14 MED ORDER — SODIUM CHLORIDE 0.9 % IV BOLUS (SEPSIS)
1000.0000 mL | Freq: Once | INTRAVENOUS | Status: AC
  Administered 2016-10-14: 1000 mL via INTRAVENOUS

## 2016-10-14 NOTE — ED Notes (Signed)
Pt unable to give urine sample at this time.  Told to let first nurse know if he needs to go.

## 2016-10-14 NOTE — ED Provider Notes (Signed)
[facility] Emergency Department Provider Note  ____________________________________________   First MD Initiated Contact with Patient 10/14/16 1407     (approximate)  I have reviewed the triage vital signs and the nursing notes.   HISTORY  Chief Complaint Abdominal Pain   HPI Thomas Orozco is a 36 y.o. male with a history of diverticulitis with a recent diagnosis of diverticulitis 2 days ago who is presenting with worsening pain now to the right side of his abdomen. He says this pain is 910 and sharp and cramping. It has slowly increased since about 1 AM this morning. He says that he has had multiple episodes of nausea, vomiting and diarrhea. No blood in the stool nor the vomitus. No known sick contacts. Says this pain is to the right upper and right lower quadrants. Says that the pain on his left side has mainly dissipated as now. Denies any radiation to the back.   Past Medical History:  Diagnosis Date  . Diverticulitis     There are no active problems to display for this patient.   History reviewed. No pertinent surgical history.  Prior to Admission medications   Medication Sig Start Date End Date Taking? Authorizing Provider  ciprofloxacin (CIPRO) 500 MG tablet Take 1 tablet (500 mg total) by mouth 2 (two) times daily. 10/13/16   Irean Hong, MD  ibuprofen (ADVIL,MOTRIN) 800 MG tablet Take 1 tablet (800 mg total) by mouth every 8 (eight) hours as needed for moderate pain. 10/13/16   Irean Hong, MD  metroNIDAZOLE (FLAGYL) 500 MG tablet Take 1 tablet (500 mg total) by mouth 2 (two) times daily. 10/13/16   Irean Hong, MD  ondansetron (ZOFRAN ODT) 4 MG disintegrating tablet Take 1 tablet (4 mg total) by mouth every 8 (eight) hours as needed for nausea or vomiting. 10/13/16   Irean Hong, MD  oxyCODONE-acetaminophen (ROXICET) 5-325 MG tablet Take 1 tablet by mouth every 4 (four) hours as needed for severe pain. 10/13/16   Irean Hong, MD  traMADol  (ULTRAM) 50 MG tablet Take 1 tablet (50 mg total) by mouth every 6 (six) hours as needed. 06/10/16   Rebecka Apley, MD    Allergies Shellfish allergy  History reviewed. No pertinent family history.  Social History Social History  Substance Use Topics  . Smoking status: Never Smoker  . Smokeless tobacco: Never Used  . Alcohol use Yes     Comment: occ    Review of Systems  Constitutional: No fever/chills Eyes: No visual changes. ENT: No sore throat. Cardiovascular: Denies chest pain. Respiratory: Denies shortness of breath. Gastrointestinal:No constipation. Genitourinary: Negative for dysuria. Musculoskeletal: Negative for back pain. Skin: Negative for rash. Neurological: Negative for headaches, focal weakness or numbness.   ____________________________________________   PHYSICAL EXAM:  VITAL SIGNS: ED Triage Vitals  Enc Vitals Group     BP 10/14/16 1152 137/82     Pulse Rate 10/14/16 1152 (!) 101     Resp 10/14/16 1152 20     Temp 10/14/16 1152 98.1 F (36.7 C)     Temp Source 10/14/16 1152 Oral     SpO2 10/14/16 1152 98 %     Weight 10/14/16 1152 (!) 315 lb (142.9 kg)     Height 10/14/16 1152  (1.803 m)     Head Circumference --      Peak Flow --      Pain Score 10/14/16 1151 8     Pain Loc --  Pain Edu? --      Excl. in GC? --     Constitutional: Alert and oriented. patient is uncomfortable Eyes: Conjunctivae are normal.  Head: Atraumatic. Nose: No congestion/rhinnorhea. Mouth/Throat: Mucous membranes are moist.  Neck: No stridor.   Cardiovascular: Normal rate, regular rhythm. Grossly normal heart sounds.  Respiratory: Normal respiratory effort.  No retractions. Lungs CTAB. Gastrointestinal: Soft with moderate to severe tenderness to the right lower and right upper quadrants with a positive Murphy sign. No distention. No CVA tenderness. Musculoskeletal: No lower extremity tenderness nor edema.  No joint effusions. Neurologic:  Normal  speech and language. No gross focal neurologic deficits are appreciated. Skin:  Skin is warm, dry and intact. No rash noted. Psychiatric: Mood and affect are normal. Speech and behavior are normal.  ____________________________________________   LABS (all labs ordered are listed, but only abnormal results are displayed)  Labs Reviewed  COMPREHENSIVE METABOLIC PANEL - Abnormal; Notable for the following:       Result Value   CO2 21 (*)    Glucose, Bld 117 (*)    Total Protein 8.3 (*)    AST 62 (*)    ALT 96 (*)    All other components within normal limits  CBC - Abnormal; Notable for the following:    WBC 11.7 (*)    All other components within normal limits  LIPASE, BLOOD  URINALYSIS, COMPLETE (UACMP) WITH MICROSCOPIC   ____________________________________________  EKG   ____________________________________________  RADIOLOGY  CT of the abdomen with bowel wall thickening of the sigmoid colon without significant surrounding information or stranding. They be changes of developing diverticulitis.ultrasound of the right upper quadrant without disease. ____________________________________________   PROCEDURES  Procedure(s) performed:   Procedures  Critical Care performed:   ____________________________________________   INITIAL IMPRESSION / ASSESSMENT AND PLAN / ED COURSE  Pertinent labs & imaging results that were available during my care of the patient were reviewed by me and considered in my medical decision making (see chart for details).  CAT scan reviewed from several days ago and no gallbladder pathology identified. Normal appendix was visualized. Concern for now acute appendicitis as the patient's white blood cell count has increased as well as his pain.  DDX: Appendicitis, perforated diverticulitis, abscess intra-abdominally, gastroenteritis, nausea, vomiting, diarrhea, cholecystitis    ----------------------------------------- 4:19 PM on  10/14/2016 -----------------------------------------  Patient with improved pain. Repalpated his abdomen and he is still tender specially the right side of the abdomen but without any guarding. Given Percocet, meds as well as crackers and patient is tolerating. Discussed with patient discharge versus inpatient treatment and he would like to be discharged home. He says he has not filled his nausea medication that he was given initially during his first visit. He says that he plans to fill it now. He is also requesting time off of work because he cannot take him off work without a work note. He knows to return for any worsening or concerning symptoms. Still without any complicating factors like abscess or perforation on the CT of the abdomen.  ____________________________________________   FINAL CLINICAL IMPRESSION(S) / ED DIAGNOSES  Final diagnoses:  RUQ pain   diverticulitis.   NEW MEDICATIONS STARTED DURING THIS VISIT:  New Prescriptions   No medications on file     Note:  This document was prepared using Dragon voice recognition software and may include unintentional dictation errors.     Myrna Blazer, MD 10/14/16 [phone]

## 2016-10-14 NOTE — ED Triage Notes (Signed)
Pt to ED from home c/o RUQ abd pain since 0100 this morning.  States having n/v/d.  Here a couple days ago for diverticulitis.

## 2016-10-14 NOTE — ED Notes (Signed)
Patient transported to CT 

## 2016-10-14 NOTE — ED Notes (Signed)
Pt returns from CT.

## 2016-11-10 ENCOUNTER — Ambulatory Visit (INDEPENDENT_AMBULATORY_CARE_PROVIDER_SITE_OTHER): Payer: Managed Care, Other (non HMO) | Admitting: Gastroenterology

## 2016-11-10 ENCOUNTER — Encounter: Payer: Self-pay | Admitting: Gastroenterology

## 2016-11-10 ENCOUNTER — Other Ambulatory Visit: Payer: Self-pay

## 2016-11-10 VITALS — BP 144/89 | HR 88 | Temp 97.8°F | Ht 71.0 in | Wt 313.8 lb

## 2016-11-10 DIAGNOSIS — R7989 Other specified abnormal findings of blood chemistry: Secondary | ICD-10-CM

## 2016-11-10 DIAGNOSIS — K5732 Diverticulitis of large intestine without perforation or abscess without bleeding: Secondary | ICD-10-CM

## 2016-11-10 DIAGNOSIS — K760 Fatty (change of) liver, not elsewhere classified: Secondary | ICD-10-CM

## 2016-11-10 DIAGNOSIS — R945 Abnormal results of liver function studies: Secondary | ICD-10-CM | POA: Diagnosis not present

## 2016-11-10 NOTE — Progress Notes (Signed)
Thomas Repress, MD [address]  [address]  [place], [place] [address]  Main: [phone]  Fax: [phone]    Gastroenterology Consultation  Referring Provider:     No ref. provider found Primary Care Physician:  Patient, No Pcp Per Primary Gastroenterologist:  Dr. Arlyss Orozco Reason for Consultation:     Recurrent diverticulitis        HPI:   Thomas Orozco is a 36 y.o. Spanish-speaking male referred by Dr. Patient, No Pcp Per  for consultation & management of recurrent diverticulitis. He is morbidly obese, otherwise healthy, had 2 ER visits, one in 06/2015 and most recently in 09/2016 secondary to left lower quadrant pain and was found to have sigmoid diverticulitis based on the CT A/P. He is also incidentally found to have fatty liver. The episodes of diverticulitis resolved after short course of Cipro and Flagyl. Currently, he denies left lower quadrant pain, nausea, vomiting, fever, chills, rectal bleeding, constipation or diarrhea. He reports that these episodes are triggered when he holds his bowels due to not finding a nearby restroom during travel. 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 He finds it quite challenging. He is accompanied by his wife today who also reports that he is gaining weight due to snacking while driving.  His most recent CT revealed sigmoid colon thickening but no definite findings for diverticulitis  He is also found to have mildly elevated transaminases on 2 separate occasions during ER visits. ALT greater than AST. He denies alcohol, IV drug abuse. No known hepatitis B or C. He denies NSAID use  He did not have any GI surgeries He denies family history of GI malignancies or other GI conditions  GI Procedures: None  Past Medical History:  Diagnosis Date  . Diverticulitis     History reviewed. No pertinent surgical history.  Prior to Admission medications   Medication Sig Start Date End Date Taking? Authorizing  Provider  ibuprofen (ADVIL,MOTRIN) 800 MG tablet Take 1 tablet (800 mg total) by mouth every 8 (eight) hours as needed for moderate pain. 10/13/16   Irean Hong, MD  ondansetron (ZOFRAN ODT) 4 MG disintegrating tablet Take 1 tablet (4 mg total) by mouth every 8 (eight) hours as needed for nausea or vomiting. 10/13/16   Irean Hong, MD  oxyCODONE-acetaminophen (ROXICET) 5-325 MG tablet Take 1 tablet by mouth every 4 (four) hours as needed for severe pain. 10/13/16   Irean Hong, MD  traMADol (ULTRAM) 50 MG tablet Take 1 tablet (50 mg total) by mouth every 6 (six) hours as needed. 06/10/16   Rebecka Apley, MD    Family History  Problem Relation Age of Onset  . Diabetes Father      Social History  Substance Use Topics  . Smoking status: Never Smoker  . Smokeless tobacco: Never Used  . Alcohol use No     Comment: occ    Allergies as of 11/10/2016 - Review Complete 11/10/2016  Allergen Reaction Noted  . Shellfish allergy Anaphylaxis 02/01/2015    Review of Systems:    All systems reviewed and negative except where noted in HPI.   Physical Exam:  BP (!) 144/89   Pulse 88   Temp 97.8 F (36.6 C) (Oral)   Ht 5\' 11"  (1.803 m)   Wt (!) 313 lb 12.8 oz (142.3 kg)   BMI 43.77 kg/m  No LMP for male patient.  General:   Alert,  Well-developed,  well-nourished, pleasant and cooperative in NAD Head:  Normocephalic and atraumatic. Eyes:  Sclera clear, no icterus.   Conjunctiva pink. Ears:  Normal auditory acuity. Nose:  No deformity, discharge, or lesions. Mouth:  No deformity or lesions,oropharynx pink & moist. Neck:  Supple; no masses or thyromegaly. Lungs:  Respirations even and unlabored.  Clear throughout to auscultation.   No wheezes, crackles, or rhonchi. No acute distress. Heart:  Regular rate and rhythm; no murmurs, clicks, rubs, or gallops. Abdomen:  Normal bowel sounds.  Morbidly obese, Soft, non-tender and non-distended without masses, hepatosplenomegaly or hernias noted.   No guarding or rebound tenderness.   Rectal: Nor performed Msk:  Symmetrical without gross deformities. Good, equal movement & strength bilaterally. Pulses:  Normal pulses noted. Extremities:  No clubbing or edema.  No cyanosis. Neurologic:  Alert and oriented x3;  grossly normal neurologically. Skin:  Intact without significant lesions or rashes. No jaundice. Lymph Nodes:  No significant cervical adenopathy. Psych:  Alert and cooperative. Normal mood and affect.  Imaging Studies: Reviewed  Assessment and Plan:   Thomas Orozco is a 36 y.o. Spanish-speaking male with morbid obesity, hepatic steatosis, recurrent acute uncomplicated diverticulitis here for further evaluation. Most recent CT revealed sigmoid colon thickening  Recurrent diverticulitis - Strongly suggested him to lose weight - Adapt healthy lifestyle - Avoid constipation - Discussed about colonoscopy to further evaluate sigmoid colon thickening and to rule out underlying malignancy I have discussed alternative options, risks & benefits,  which include, but are not limited to, bleeding, infection, perforation,respiratory complication & drug reaction.  The patient agrees with this plan & written consent will be obtained.    Elevated transaminases: Most probably secondary to fatty liver disease, non-alcohol - Counseled him about lifestyle changes to lose weight to prevent further progression of fatty liver disease, he set a goal to lose about 10-15 pounds in next 3 months - Recheck LFTs in 3 months, if still elevated or worsening I will perform secondary liver disease workup  Follow up in 3 months   Thomas Repressohini R Jerni Selmer, MD

## 2016-12-09 NOTE — Discharge Instructions (Signed)
General Anesthesia, Adult, Care After °These instructions provide you with information about caring for yourself after your procedure. Your health care provider may also give you more specific instructions. Your treatment has been planned according to current medical practices, but problems sometimes occur. Call your health care provider if you have any problems or questions after your procedure. °What can I expect after the procedure? °After the procedure, it is common to have: °· Vomiting. °· A sore throat. °· Mental slowness. ° °It is common to feel: °· Nauseous. °· Cold or shivery. °· Sleepy. °· Tired. °· Sore or achy, even in parts of your body where you did not have surgery. ° °Follow these instructions at home: °For at least 24 hours after the procedure: °· Do not: °? Participate in activities where you could fall or become injured. °? Drive. °? Use heavy machinery. °? Drink alcohol. °? Take sleeping pills or medicines that cause drowsiness. °? Make important decisions or sign legal documents. °? Take care of children on your own. °· Rest. °Eating and drinking °· If you vomit, drink water, juice, or soup when you can drink without vomiting. °· Drink enough fluid to keep your urine clear or pale yellow. °· Make sure you have little or no nausea before eating solid foods. °· Follow the diet recommended by your health care provider. °General instructions °· Have a responsible adult stay with you until you are awake and alert. °· Return to your normal activities as told by your health care provider. Ask your health care provider what activities are safe for you. °· Take over-the-counter and prescription medicines only as told by your health care provider. °· If you smoke, do not smoke without supervision. °· Keep all follow-up visits as told by your health care provider. This is important. °Contact a health care provider if: °· You continue to have nausea or vomiting at home, and medicines are not helpful. °· You  cannot drink fluids or start eating again. °· You cannot urinate after 8-12 hours. °· You develop a skin rash. °· You have fever. °· You have increasing redness at the site of your procedure. °Get help right away if: °· You have difficulty breathing. °· You have chest pain. °· You have unexpected bleeding. °· You feel that you are having a life-threatening or urgent problem. °This information is not intended to replace advice given to you by your health care provider. Make sure you discuss any questions you have with your health care provider. °Document Released: 04/14/2000 Document Revised: 06/11/2015 Document Reviewed: 12/21/2014 °Elsevier Interactive Patient Education © 2018 [employer] ° °

## 2016-12-10 ENCOUNTER — Ambulatory Visit: Payer: Managed Care, Other (non HMO) | Admitting: Anesthesiology

## 2016-12-10 ENCOUNTER — Encounter
Admission: RE | Disposition: A | Discharge status: Home / Self Care | Payer: Self-pay | Source: Ambulatory Visit | Attending: Gastroenterology

## 2016-12-10 ENCOUNTER — Ambulatory Visit
Admission: RE | Admit: 2016-12-10 | Discharge: 2016-12-10 | Disposition: A | Discharge status: Home / Self Care | Payer: Managed Care, Other (non HMO) | Source: Ambulatory Visit | Attending: Gastroenterology | Admitting: Gastroenterology

## 2016-12-10 DIAGNOSIS — Z91013 Allergy to seafood: Secondary | ICD-10-CM | POA: Diagnosis not present

## 2016-12-10 DIAGNOSIS — K5792 Diverticulitis of intestine, part unspecified, without perforation or abscess without bleeding: Secondary | ICD-10-CM | POA: Diagnosis not present

## 2016-12-10 DIAGNOSIS — Z79899 Other long term (current) drug therapy: Secondary | ICD-10-CM | POA: Insufficient documentation

## 2016-12-10 DIAGNOSIS — K639 Disease of intestine, unspecified: Secondary | ICD-10-CM

## 2016-12-10 DIAGNOSIS — K5730 Diverticulosis of large intestine without perforation or abscess without bleeding: Secondary | ICD-10-CM | POA: Insufficient documentation

## 2016-12-10 DIAGNOSIS — K5732 Diverticulitis of large intestine without perforation or abscess without bleeding: Secondary | ICD-10-CM | POA: Diagnosis present

## 2016-12-10 HISTORY — PX: COLONOSCOPY WITH PROPOFOL: SHX5780

## 2016-12-10 SURGERY — COLONOSCOPY WITH PROPOFOL
Anesthesia: General | Wound class: Clean Contaminated

## 2016-12-10 MED ORDER — ACETAMINOPHEN 160 MG/5ML PO SOLN: 325.0000 mg | Freq: Once | ORAL | Status: DC

## 2016-12-10 MED ORDER — LACTATED RINGERS IV SOLN
INTRAVENOUS | Status: DC
  Administered 2016-12-10: 10:00:00 via INTRAVENOUS

## 2016-12-10 MED ORDER — PROPOFOL 10 MG/ML IV BOLUS
INTRAVENOUS | Status: DC | PRN
  Administered 2016-12-10: 50 mg via INTRAVENOUS
  Administered 2016-12-10: 150 mg via INTRAVENOUS
  Administered 2016-12-10 (×7): 50 mg via INTRAVENOUS

## 2016-12-10 MED ORDER — LACTATED RINGERS IV SOLN: 1000.0000 mL | INTRAVENOUS | Status: DC

## 2016-12-10 MED ORDER — ACETAMINOPHEN 325 MG PO TABS: 325.0000 mg | ORAL_TABLET | Freq: Once | ORAL | Status: DC

## 2016-12-10 MED ORDER — STERILE WATER FOR IRRIGATION IR SOLN
Status: DC | PRN
  Administered 2016-12-10: 10:00:00

## 2016-12-10 SURGICAL SUPPLY — 23 items

## 2016-12-10 NOTE — Anesthesia Procedure Notes (Signed)
Procedure Name: MAC Date/Time: 12/10/2016 9:39 AM Performed by: Janna Arch, CRNA Pre-anesthesia Checklist: Patient identified, Emergency Drugs available, Suction available and Patient being monitored Patient Re-evaluated:Patient Re-evaluated prior to induction Oxygen Delivery Method: Nasal cannula

## 2016-12-10 NOTE — Anesthesia Preprocedure Evaluation (Signed)
Anesthesia Evaluation  Patient identified by MRN, date of birth, ID band Patient awake    Reviewed: Allergy & Precautions, H&P , NPO status , Patient's Chart, lab work & pertinent test results  Airway Mallampati: II  TM Distance: >3 FB Neck ROM: full    Dental no notable dental hx.    Pulmonary    Pulmonary exam normal breath sounds clear to auscultation       Cardiovascular Normal cardiovascular exam Rhythm:regular Rate:Normal     Neuro/Psych    GI/Hepatic   Endo/Other    Renal/GU      Musculoskeletal   Abdominal   Peds  Hematology   Anesthesia Other Findings   Reproductive/Obstetrics                             Anesthesia Physical Anesthesia Plan  ASA: II  Anesthesia Plan: General   Post-op Pain Management:    Induction: Intravenous  PONV Risk Score and Plan: 2 and Propofol infusion  Airway Management Planned: Natural Airway  Additional Equipment:   Intra-op Plan:   Post-operative Plan:   Informed Consent: I have reviewed the patients History and Physical, chart, labs and discussed the procedure including the risks, benefits and alternatives for the proposed anesthesia with the patient or authorized representative who has indicated his/her understanding and acceptance.     Plan Discussed with: CRNA  Anesthesia Plan Comments:         Anesthesia Quick Evaluation

## 2016-12-10 NOTE — Op Note (Signed)
[facility] Gastroenterology Patient Name: Thomas Orozco Procedure Date: 12/10/2016 9:31 AM MRN: [id number] Account #: [id number] Date of Birth: 1980/06/27 Admit Type: Outpatient Age: 36 Room: [facility] OR ROOM 01 Gender: Male Note Status: Finalized Procedure:            Colonoscopy Indications:          Abdominal pain in the left lower quadrant, Abnormal CT                        of the GI tract, Diverticulitis, h/o recurrent                        diverticulitis Providers:            Lin Landsman MD, MD Medicines:            Monitored Anesthesia Care Complications:        No immediate complications. Estimated blood loss: None. Procedure:            Pre-Anesthesia Assessment:                       - Prior to the procedure, a History and Physical was                        performed, and patient medications and allergies were                        reviewed. The patient is competent. The risks and                        benefits of the procedure and the sedation options and                        risks were discussed with the patient. All questions                        were answered and informed consent was obtained.                        Patient identification and proposed procedure were                        verified by the physician, the nurse, the                        anesthesiologist, the anesthetist and the technician in                        the pre-procedure area in the procedure room. Mental                        Status Examination: alert and oriented. Airway                        Examination: normal oropharyngeal airway and neck                        mobility. Respiratory Examination: clear to  auscultation. CV Examination: normal. Prophylactic                        Antibiotics: The patient does not require prophylactic                        antibiotics. Prior Anticoagulants: The patient has   taken no previous anticoagulant or antiplatelet agents.                        ASA Grade Assessment: II - A patient with mild systemic                        disease. After reviewing the risks and benefits, the                        patient was deemed in satisfactory condition to undergo                        the procedure. The anesthesia plan was to use monitored                        anesthesia care (MAC). Immediately prior to                        administration of medications, the patient was                        re-assessed for adequacy to receive sedatives. The                        heart rate, respiratory rate, oxygen saturations, blood                        pressure, adequacy of pulmonary ventilation, and                        response to care were monitored throughout the                        procedure. The physical status of the patient was                        re-assessed after the procedure.                       After obtaining informed consent, the colonoscope was                        passed under direct vision. Throughout the procedure,                        the patient's blood pressure, pulse, and oxygen                        saturations were monitored continuously. The [place] [id number]) was introduced through the  anus and advanced to the the cecum, identified by                        appendiceal orifice and ileocecal valve. The                        colonoscopy was performed without difficulty. The                        patient tolerated the procedure well. The quality of                        the bowel preparation was evaluated using the BBPS                        [facility] Bowel Preparation Scale) with scores of: Right                        Colon = 3, Transverse Colon = 3 and Left Colon = 3                        (entire mucosa seen well with no residual staining,                         small fragments of stool or opaque liquid). The total                        BBPS score equals 9. Findings:      The perianal and digital rectal examinations were normal. Pertinent       negatives include normal sphincter tone and no palpable rectal lesions.      Multiple small and large-mouthed diverticula were found in the sigmoid       colon and descending colon.      The retroflexed view of the distal rectum and anal verge was normal and       showed no anal or rectal abnormalities.      The exam was otherwise without abnormality. There was no evidence of IBD      Normal mucosa was found in the entire colon. Impression:           - Diverticulosis in the sigmoid colon and in the                        descending colon.                       - The distal rectum and anal verge are normal on                        retroflexion view.                       - No specimens collected. Recommendation:       - Discharge patient to home.                       - Resume previous diet.                       - Continue present medications.                       -  Repeat colonoscopy at age 3 for colon cancer                        screening. Procedure Code(s):    --- Professional ---                       [phone], Colonoscopy, flexible; diagnostic, including                        collection of specimen(s) by brushing or washing, when                        performed (separate procedure) Diagnosis Code(s):    --- Professional ---                       R10.32, Left lower quadrant pain                       K57.32, Diverticulitis of large intestine without                        perforation or abscess without bleeding                       K57.30, Diverticulosis of large intestine without                        perforation or abscess without bleeding                       R93.3, Abnormal findings on diagnostic imaging of other                        parts of digestive tract CPT copyright 2016  American Medical Association. All rights reserved. The codes documented in this report are preliminary and upon coder review may  be revised to meet current compliance requirements. Dr. Ulyess Mort Lin Landsman MD, MD 12/10/2016 10:05:28 AM This report has been signed electronically. Number of Addenda: 0 Note Initiated On: 12/10/2016 9:31 AM Scope Withdrawal Time: 0 hours 8 minutes 40 seconds  Total Procedure Duration: 0 hours 14 minutes 28 seconds       [facility]

## 2016-12-10 NOTE — Transfer of Care (Signed)
Immediate Anesthesia Transfer of Care Note  Patient: Thomas Orozco  Procedure(s) Performed: COLONOSCOPY WITH PROPOFOL (N/A )  Patient Location: PACU  Anesthesia Type: General  Level of Consciousness: awake, alert  and patient cooperative  Airway and Oxygen Therapy: Patient Spontanous Breathing and Patient connected to supplemental oxygen  Post-op Assessment: Post-op Vital signs reviewed, Patient's Cardiovascular Status Stable, Respiratory Function Stable, Patent Airway and No signs of Nausea or vomiting  Post-op Vital Signs: Reviewed and stable  Complications: No apparent anesthesia complications

## 2016-12-10 NOTE — Anesthesia Postprocedure Evaluation (Signed)
Anesthesia Post Note  Patient: Thomas Orozco  Procedure(s) Performed: COLONOSCOPY WITH PROPOFOL (N/A )  Patient location during evaluation: PACU Anesthesia Type: General Level of consciousness: awake and alert and oriented Pain management: satisfactory to patient Vital Signs Assessment: post-procedure vital signs reviewed and stable Respiratory status: spontaneous breathing, nonlabored ventilation and respiratory function stable Cardiovascular status: blood pressure returned to baseline and stable Postop Assessment: Adequate PO intake and No signs of nausea or vomiting Anesthetic complications: no    Cherly BeachStella, Elianna Windom J

## 2016-12-10 NOTE — H&P (Signed)
  Arlyss Repressohini R Myca Perno, MD [address]  [address]  [place], [place] [address]  Main: [phone]  Fax: [phone] Pager: [phone]  Primary Care Physician:  Patient, No Pcp Per Primary Gastroenterologist:  Dr. Arlyss Repressohini R Uzma Hellmer  Pre-Procedure History & Physical: HPI:  Thomas Orozco is a 36 y.o. male is here for an colonoscopy.   Past Medical History:  Diagnosis Date  . Diverticulitis     History reviewed. No pertinent surgical history.  Prior to Admission medications   Medication Sig Start Date End Date Taking? Authorizing Provider  ibuprofen (ADVIL,MOTRIN) 800 MG tablet Take 1 tablet (800 mg total) by mouth every 8 (eight) hours as needed for moderate pain. Patient not taking: Reported on 12/04/2016 10/13/16   Irean HongSung, Jade J, MD  ondansetron (ZOFRAN ODT) 4 MG disintegrating tablet Take 1 tablet (4 mg total) by mouth every 8 (eight) hours as needed for nausea or vomiting. Patient not taking: Reported on 12/04/2016 10/13/16   Irean HongSung, Jade J, MD  oxyCODONE-acetaminophen (ROXICET) 5-325 MG tablet Take 1 tablet by mouth every 4 (four) hours as needed for severe pain. Patient not taking: Reported on 12/04/2016 10/13/16   Irean HongSung, Jade J, MD  traMADol (ULTRAM) 50 MG tablet Take 1 tablet (50 mg total) by mouth every 6 (six) hours as needed. Patient not taking: Reported on 12/04/2016 06/10/16   Rebecka ApleyWebster, Allison P, MD    Allergies as of 11/10/2016 - Review Complete 11/10/2016  Allergen Reaction Noted  . 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 Diabetes Father     Social History   Socioeconomic History  . Marital status: Single    Spouse name: Not on file  . Number of children: Not on file  . Years of education: Not on file  . Highest education level: Not on file  Social Needs  . Financial resource strain: Not on file  . Food insecurity - worry: Not on file  . Food insecurity - inability: Not on file  . Transportation needs -  medical: Not on file  . Transportation needs - non-medical: Not on file  Occupational History  . Not on file  Tobacco Use  . Smoking status: Never Smoker  . Smokeless tobacco: Never Used  Substance and Sexual Activity  . Alcohol use: No    Comment: occ  . Drug use: No  . Sexual activity: Yes  Other Topics Concern  . Not on file  Social History Narrative  . Not on file    Review of Systems: See HPI, otherwise negative ROS  Physical Exam: Ht 5\' 11"  (1.803 m)   Wt (!) 313 lb (142 kg)   BMI 43.65 kg/m  General:   Alert,  pleasant and cooperative in NAD Head:  Normocephalic and atraumatic. Neck:  Supple; no masses or thyromegaly. Lungs:  Clear throughout to auscultation.    Heart:  Regular rate and rhythm. Abdomen:  Soft, nontender and nondistended. Normal bowel sounds, without guarding, and without rebound.   Neurologic:  Alert and  oriented x4;  grossly normal neurologically.  Impression/Plan: Thomas Barterslexander Jarnagin is here for an colonoscopy to be performed for sigmoid colon thickening, recurrent diverticulitis  Risks, benefits, limitations, and alternatives regarding  colonoscopy have been reviewed with the patient.  Questions have been answered.  All parties agreeable.   Lannette Donathohini Mei Suits, MD  12/10/2016, 9:10 AM

## 2016-12-12 ENCOUNTER — Encounter: Payer: Self-pay | Admitting: Gastroenterology

## 2017-02-09 ENCOUNTER — Ambulatory Visit: Payer: Managed Care, Other (non HMO) | Admitting: Gastroenterology

## 2017-02-16 ENCOUNTER — Other Ambulatory Visit: Payer: Self-pay

## 2017-02-16 ENCOUNTER — Ambulatory Visit: Payer: Managed Care, Other (non HMO) | Admitting: Gastroenterology

## 2018-03-31 ENCOUNTER — Ambulatory Visit
Admission: RE | Admit: 2018-03-31 | Discharge: 2018-03-31 | Disposition: A | Discharge status: Home / Self Care | Payer: No Typology Code available for payment source | Attending: Family Medicine | Admitting: Family Medicine

## 2018-03-31 ENCOUNTER — Other Ambulatory Visit: Payer: Self-pay

## 2018-03-31 ENCOUNTER — Ambulatory Visit
Admission: RE | Admit: 2018-03-31 | Discharge: 2018-03-31 | Disposition: A | Discharge status: Home / Self Care | Payer: No Typology Code available for payment source | Source: Ambulatory Visit | Attending: Family Medicine | Admitting: Family Medicine

## 2018-03-31 ENCOUNTER — Other Ambulatory Visit: Payer: Self-pay | Admitting: Family Medicine

## 2018-03-31 DIAGNOSIS — R52 Pain, unspecified: Secondary | ICD-10-CM | POA: Insufficient documentation

## 2018-03-31 DIAGNOSIS — S99921A Unspecified injury of right foot, initial encounter: Secondary | ICD-10-CM | POA: Diagnosis present

## 2019-07-15 IMAGING — CR RIGHT FOOT COMPLETE - 3+ VIEW
1 series · 3 of 3 positions shown · non-contrast
Comparison: None.

CLINICAL DATA: Plantar and lateral heel pain following injury
yesterday. Painful weight-bearing.

EXAM:
RIGHT FOOT COMPLETE - 3+ VIEW

[Series 1: dg foot complete right · 0.14mm/px · 3 of 3 slices shown]
[im 1/3]
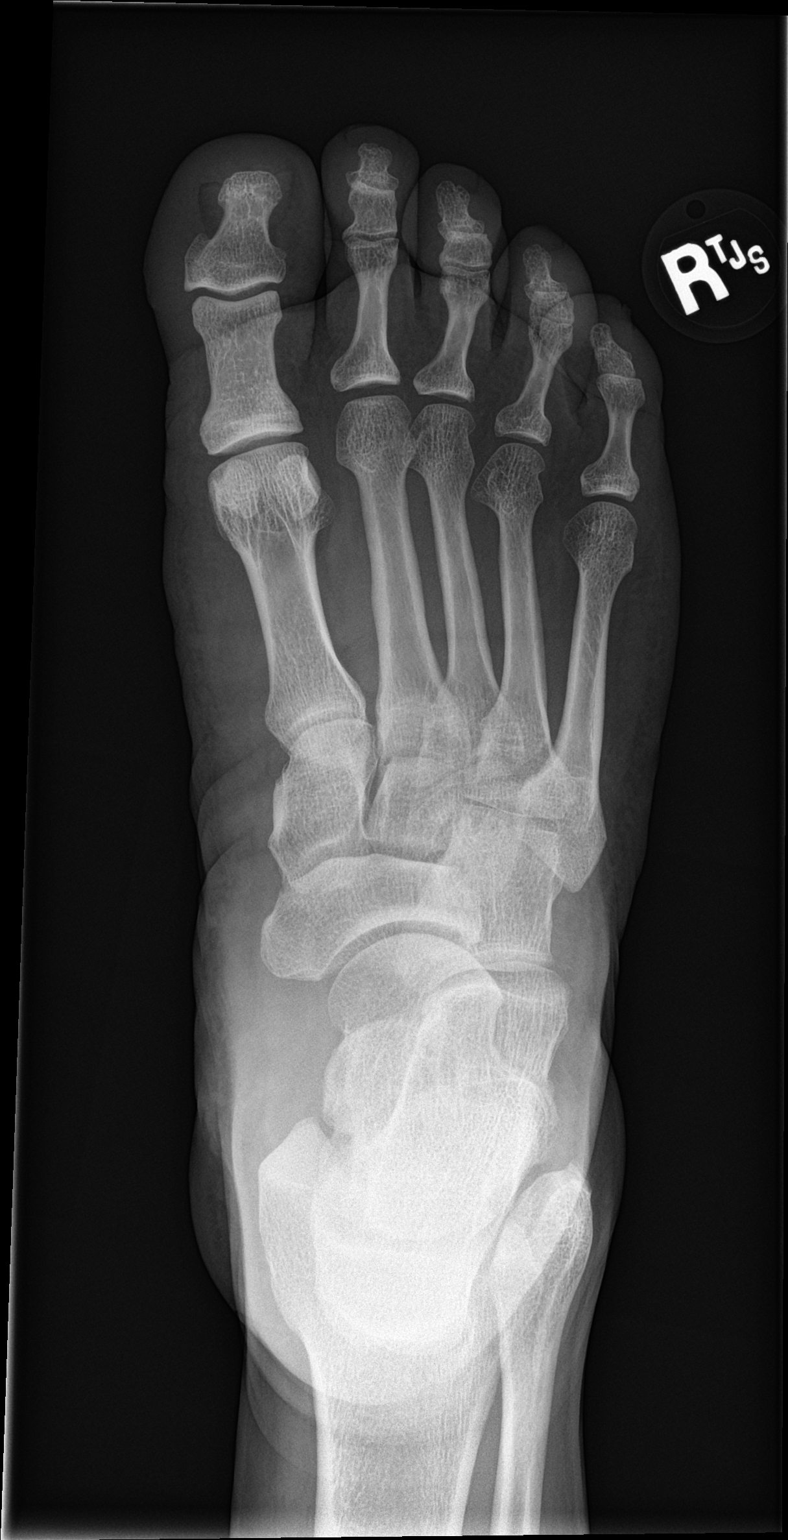
[im 2/3]
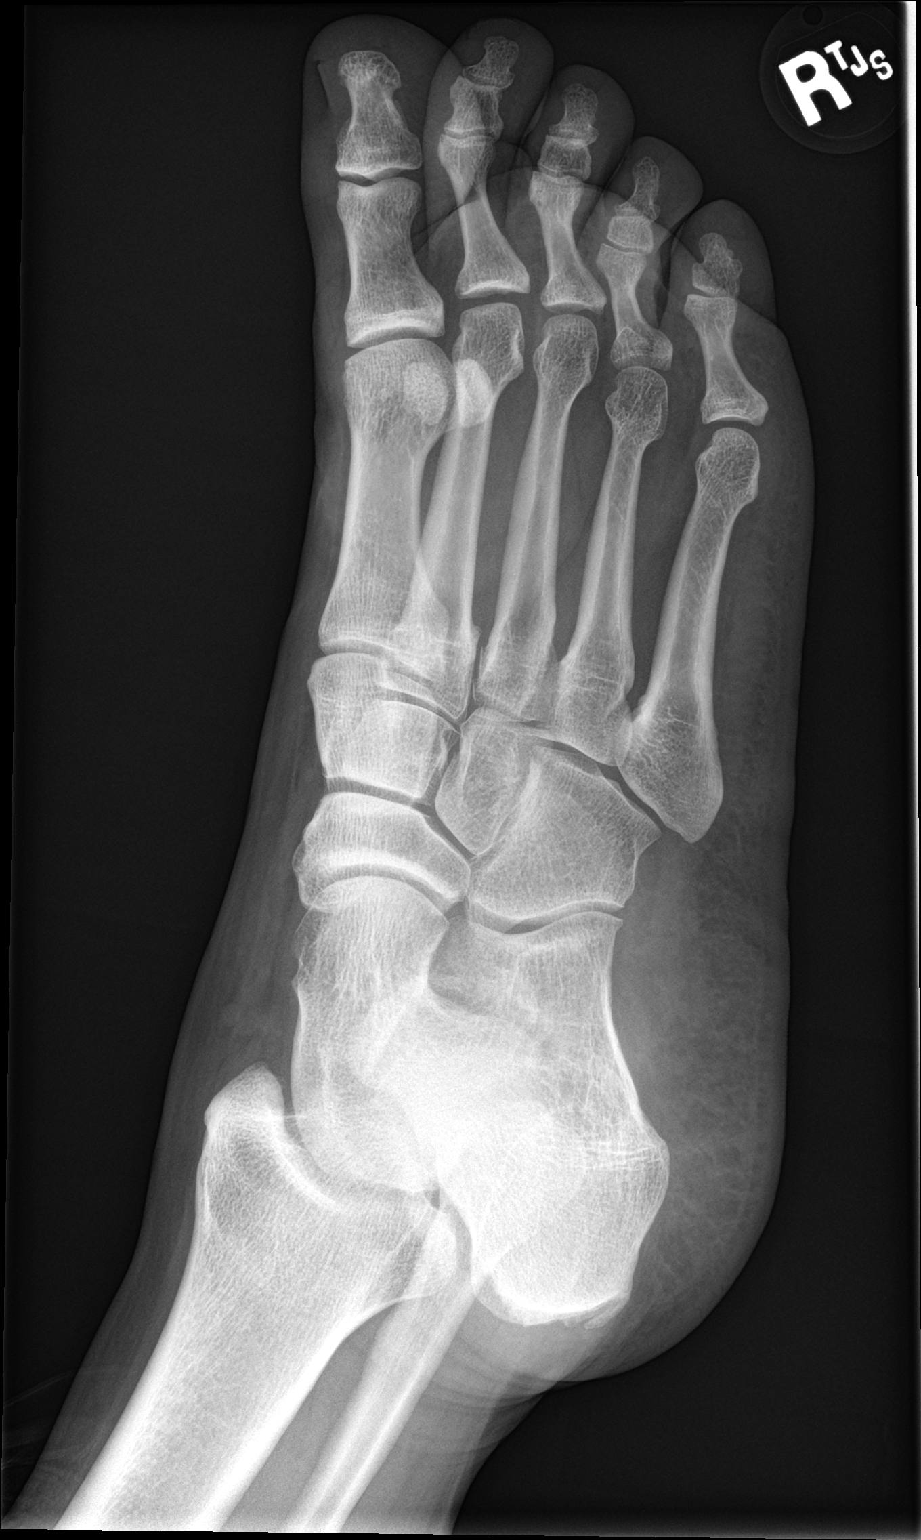
[im 3/3]
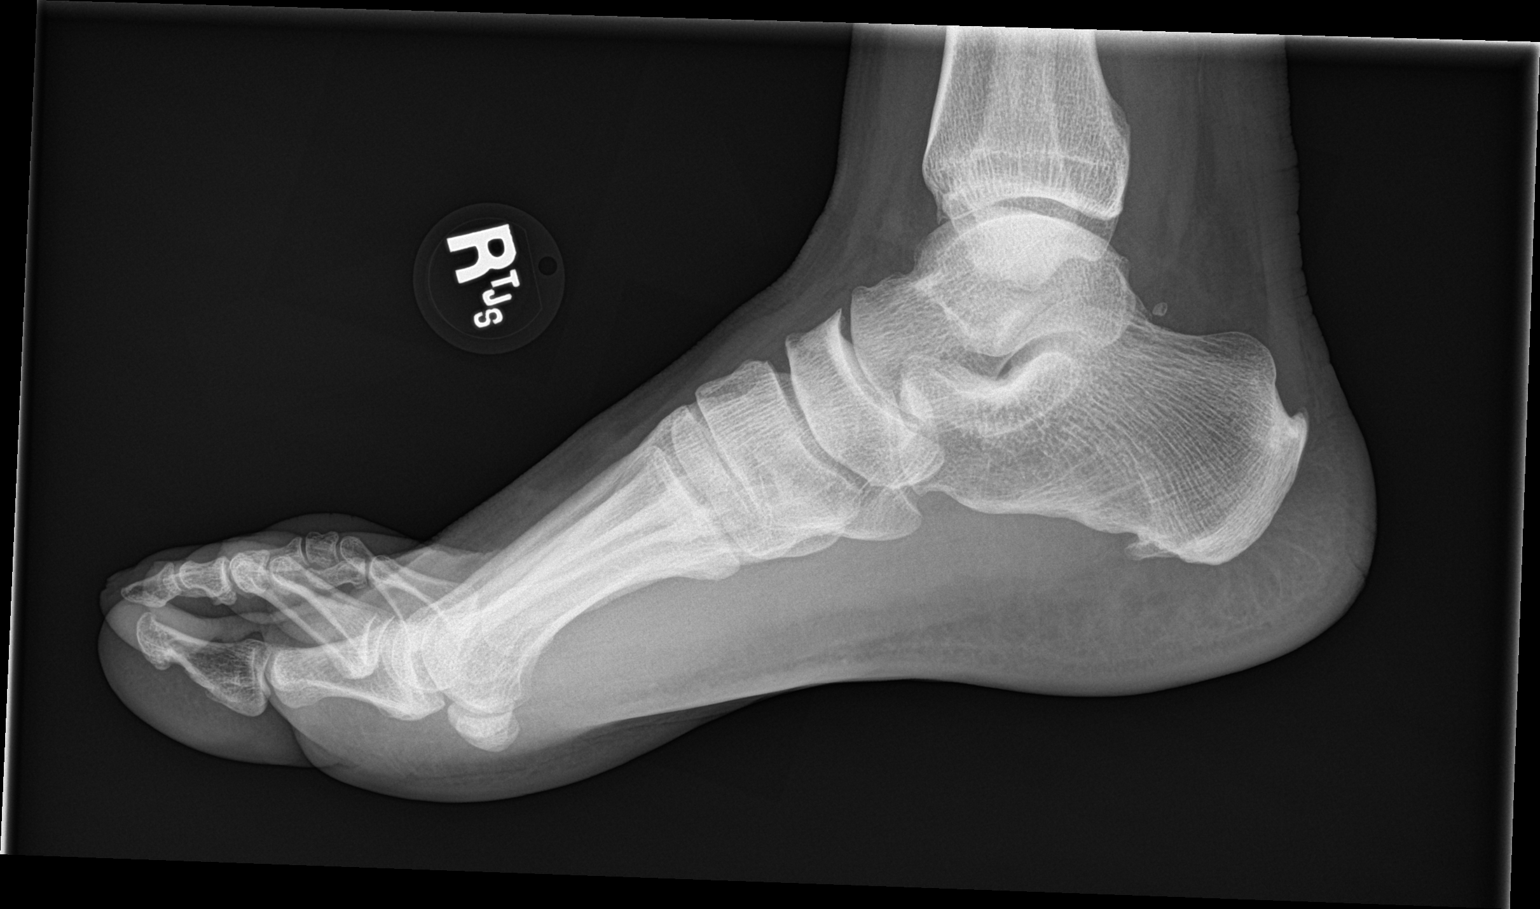

[3 of 3 positions shown; findings below may reference images not displayed]

FINDINGS: The mineralization and alignment are normal. There is no evidence of
acute fracture or dislocation. There are small calcaneal spurs. No
focal soft tissue swelling or foreign body identified.
IMPRESSION: No acute osseous findings.

## 2023-12-14 ENCOUNTER — Emergency Department
Admission: EM | Admit: 2023-12-14 | Discharge: 2023-12-14 | Disposition: A | Discharge status: Home / Self Care | Attending: Emergency Medicine | Admitting: Emergency Medicine

## 2023-12-14 ENCOUNTER — Emergency Department

## 2023-12-14 ENCOUNTER — Other Ambulatory Visit: Payer: Self-pay

## 2023-12-14 DIAGNOSIS — K859 Acute pancreatitis without necrosis or infection, unspecified: Secondary | ICD-10-CM

## 2023-12-14 DIAGNOSIS — R079 Chest pain, unspecified: Secondary | ICD-10-CM | POA: Diagnosis present

## 2023-12-14 DIAGNOSIS — I10 Essential (primary) hypertension: Secondary | ICD-10-CM | POA: Diagnosis not present

## 2023-12-14 HISTORY — DX: Essential (primary) hypertension: I10

## 2023-12-14 LAB — CBC
HCT: 40.1 % (ref 39.0–52.0)
Hemoglobin: 14.2 g/dL (ref 13.0–17.0)
MCH: 30.3 pg (ref 26.0–34.0)
MCHC: 35.4 g/dL (ref 30.0–36.0)
MCV: 85.7 fL (ref 80.0–100.0)
Platelets: 314 K/uL (ref 150–400)
RBC: 4.68 MIL/uL (ref 4.22–5.81)
RDW: 12.8 % (ref 11.5–15.5)
WBC: 7.6 K/uL (ref 4.0–10.5)
nRBC: 0 % (ref 0.0–0.2)

## 2023-12-14 LAB — BASIC METABOLIC PANEL WITH GFR
Anion gap: 9 (ref 5–15)
BUN: 9 mg/dL (ref 6–20)
CO2: 24 mmol/L (ref 22–32)
Calcium: 8.6 mg/dL — ABNORMAL LOW (ref 8.9–10.3)
Chloride: 104 mmol/L (ref 98–111)
Creatinine, Ser: 0.71 mg/dL (ref 0.61–1.24)
GFR, Estimated: >60 mL/min (ref >60)
Glucose, Bld: 106 mg/dL — ABNORMAL HIGH (ref 70–99)
Potassium: 3.4 mmol/L — ABNORMAL LOW (ref 3.5–5.1)
Sodium: 137 mmol/L (ref 135–145)

## 2023-12-14 LAB — HEPATIC FUNCTION PANEL
ALT: 49 U/L — ABNORMAL HIGH (ref 0–44)
AST: 32 U/L (ref 15–41)
Albumin: 4.1 g/dL (ref 3.5–5.0)
Alkaline Phosphatase: 69 U/L (ref 38–126)
Bilirubin, Direct: 0.1 mg/dL (ref 0.0–0.2)
Indirect Bilirubin: 0.2 mg/dL — ABNORMAL LOW (ref 0.3–0.9)
Total Bilirubin: 0.3 mg/dL (ref 0.0–1.2)
Total Protein: 7.3 g/dL (ref 6.5–8.1)

## 2023-12-14 LAB — LIPASE, BLOOD: Lipase: 142 U/L — ABNORMAL HIGH (ref 11–51)

## 2023-12-14 LAB — TROPONIN T, HIGH SENSITIVITY
Troponin T High Sensitivity: <15 ng/L (ref 0–19)
Troponin T High Sensitivity: <15 ng/L (ref 0–19)

## 2023-12-14 NOTE — ED Triage Notes (Signed)
 Pt to ED for chest pain started this am, like something sitting chest. Reports feeling like needs to burp and like body is stressed and anxious. Unsure if something ate.

## 2023-12-14 NOTE — ED Provider Notes (Addendum)
 [facility] Provider Note    Event Date/Time   First MD Initiated Contact with Patient 12/14/23 0522     (approximate)   History   Chest Pain   HPI  Thomas Orozco is a 43 y.o. male with hypertension who comes in with concerns for chest pain.  Patient reports having some chest pain that felt like a burp and there was a feeling like maybe something was stuck in his throat.  He states that he was able to drink water  and he tried to fall back asleep but he could not.  He then started feeling very anxious about if he was having a heart attack he has children at home and he did not want to miss something related to his heart.  He reports being very stressed recently.  He denies any real pain more of just like a sensation in his chest like something is stuck.  Does report he ate some different food yesterday that he is not sure if it could be related to that.   Physical Exam   Triage Vital Signs: ED Triage Vitals [12/14/23 0457]  Encounter Vitals Group     BP (!) 172/109     Girls Systolic BP Percentile      Girls Diastolic BP Percentile      Boys Systolic BP Percentile      Boys Diastolic BP Percentile      Pulse Rate 81     Resp 20     Temp 98 F (36.7 C)     Temp src      SpO2 100 %     Weight (!) 316 lb (143.3 kg)     Height 5' 11 (1.803 m)     Head Circumference      Peak Flow      Pain Score 5     Pain Loc      Pain Education      Exclude from Growth Chart     Most recent vital signs: Vitals:   12/14/23 0457  BP: (!) 172/109  Pulse: 81  Resp: 20  Temp: 98 F (36.7 C)  SpO2: 100%     General: Awake, no distress.  CV:  Good peripheral perfusion.  No murmur Resp:  Normal effort.  Abd:  No distention.  Soft and nontender Other:  Equal pulses.  Sensation intact.   ED Results / Procedures / Treatments   Labs (all labs ordered are listed, but only abnormal results are displayed) Labs Reviewed  BASIC METABOLIC PANEL WITH GFR -  Abnormal; Notable for the following components:      Result Value   Potassium 3.4 (*)    Glucose, Bld 106 (*)    Calcium 8.6 (*)    All other components within normal limits  CBC  TROPONIN T, HIGH SENSITIVITY  TROPONIN T, HIGH SENSITIVITY     EKG  My interpretation of EKG:  Normal sinus rhythm 86 without any ST elevation or T wave inversions, QTc of 490  RADIOLOGY I have reviewed the xray personally and interpreted no evidence of any pneumonia   PROCEDURES:  Critical Care performed: No  .1-3 Lead EKG Interpretation  Performed by: Ernest Ronal BRAVO, MD Authorized by: Ernest Ronal BRAVO, MD     Interpretation: normal     ECG rate:  80   ECG rate assessment: normal     Rhythm: sinus rhythm     Ectopy: none     Conduction: normal  MEDICATIONS ORDERED IN ED: Medications - No data to display   IMPRESSION / MDM / ASSESSMENT AND PLAN / ED COURSE  I reviewed the triage vital signs and the nursing notes.   Patient's presentation is most consistent with acute presentation with potential threat to life or bodily function.   Patient comes in with pain in the chest.  Patient is hypertensive but he reports feeling very anxious and stressed so could be related to that.  Patient did drive here she does not want any anxiolytics.  Offered something for pain and he states he does not really have any pain he just feels very stressed. Differential includes ACS, acid reflux.  He really has no upper abdominal pain to suggest pancreatitis, choledocholithiasis.  Will add on labs to evaluate for this.  He denies any shortness of breath and this does not sound consistent with PE.  He also reports no pain and has near resolution of symptoms so seems less likely dissection.  He is got no numbness no tingling good pulses throughout.  Chest x-ray without evidence of widened mediastinum.  Does not sound like food bolus as patient does report tolerating drinking.  6:41 AM repeat evaluation he denies any  symptoms.  He reports that he would just feels like he was stressed out earlier.  He denies any pain at this time.  7:17 AM patient updated waiting on repeat troponin.  He continues deny any pain.  Patient be handed off to oncoming team pending hepatic function, lipase, repeat troponin, recheck BP but if reassuring suspect patient can be discharged home if patient continues to not have any pain.   The patient is on the cardiac monitor to evaluate for evidence of arrhythmia and/or significant heart rate changes.      FINAL CLINICAL IMPRESSION(S) / ED DIAGNOSES   Final diagnoses:  Nonspecific chest pain     Rx / DC Orders   ED Discharge Orders     None        Note:  This document was prepared using Dragon voice recognition software and may include unintentional dictation errors.   Ernest Ronal BRAVO, MD 12/14/23 [phone]    Ernest Ronal BRAVO, MD 12/14/23 [phone]

## 2023-12-14 NOTE — Discharge Instructions (Addendum)
 Your lab work shows some inflammation in your pancreas, which can cause chest or abdominal pain, vomiting and other symptoms.  Sometimes this can become more severe.  You should follow-up with your primary care doctor to have an ultrasound done to make sure that you do not have any gallstones, as these can sometimes cause pancreas inflammation.  You should keep your diet very bland for the next several days, eating broth, light foods, and avoiding anything spicy or fatty.  Follow-up with your primary care doctor.  Return to the ER immediately for new, worsening, or persistent severe chest or abdominal pain, vomiting, or any other new or worsening symptoms that concern you.

## 2023-12-14 NOTE — ED Provider Notes (Signed)
-----------------------------------------   8:26 AM on 12/14/2023 -----------------------------------------  I took over care of this patient from Dr. Ernest.  Repeat troponin is negative.  LFTs are unremarkable except for borderline ALT.  However, the lipase is mildly elevated.  This is consistent with mild acute pancreatitis.  On reassessment, the patient states that his symptoms are completely resolved.  He feels well and would like to go home.  I counseled him on the lab results.  The patient states that he does not drink alcohol frequently, and has not had any alcohol in the last year.  We discussed the possibility of gallstones.  I recommended that we obtain an ultrasound to rule out gallstones, although based on the lab workup there is no evidence of a biliary obstruction.  However, the patient declines this additional workup in the ED.  He would prefer to do this as an outpatient.  Given that his symptoms have completely resolved, and there are no other concerning lab findings besides the elevated lipase, I think that this is reasonable.  The patient has no significant risk factors for complicated acute pancreatitis.  His harmless acute pancreatitis score is 0.  He is appropriate for discharge home at this time.  I advised him on the appropriate diet for mild acute pancreatitis.  I gave strict return precautions, and he expressed understanding.   Jacolyn Pae, MD 12/14/23 [phone]
